# Patient Record
Sex: Female | Born: 2000 | Race: White | Hispanic: No | Marital: Single | Smoking: Never smoker
Health system: Southern US, Community
[De-identification: ages and names within clinical notes are randomized; demographics above are authoritative.]

## PROBLEM LIST (undated history)

## (undated) DIAGNOSIS — J45909 Unspecified asthma, uncomplicated: Secondary | ICD-10-CM

## (undated) HISTORY — PX: OTHER SURGICAL HISTORY: SHX169

---
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
## 2022-03-12 ENCOUNTER — Other Ambulatory Visit: Payer: Self-pay

## 2022-03-12 ENCOUNTER — Emergency Department
Admission: EM | Admit: 2022-03-12 | Discharge: 2022-03-12 | Disposition: A | Discharge status: Home / Self Care | Payer: BC Managed Care – PPO | Attending: Emergency Medicine | Admitting: Emergency Medicine

## 2022-03-12 ENCOUNTER — Emergency Department: Payer: BC Managed Care – PPO

## 2022-03-12 DIAGNOSIS — S6991XA Unspecified injury of right wrist, hand and finger(s), initial encounter: Secondary | ICD-10-CM | POA: Diagnosis present

## 2022-03-12 DIAGNOSIS — Y9364 Activity, baseball: Secondary | ICD-10-CM | POA: Insufficient documentation

## 2022-03-12 DIAGNOSIS — S60221A Contusion of right hand, initial encounter: Secondary | ICD-10-CM | POA: Insufficient documentation

## 2022-03-12 DIAGNOSIS — W2107XA Struck by softball, initial encounter: Secondary | ICD-10-CM | POA: Insufficient documentation

## 2022-03-12 NOTE — Discharge Instructions (Addendum)
Alternate Tylenol and ibuprofen for pain

## 2022-03-12 NOTE — ED Triage Notes (Signed)
Pt presents to ER c/o right hand pain after getting hit by ball around 1945 while playing softball. Pt has some abrasions noted to right hand along with some swelling to inside of right hand.  Pt is able to move all fingers, but states she is having some numbness in her right pinky.  Pt denies any other injuries.  Pt A&O x4 at this time in NAD.

## 2022-03-12 NOTE — ED Provider Notes (Signed)
[facility] Provider Note  Patient Contact: 10:22 PM (approximate)   History   Hand Pain   HPI  Erika Wood is a 21 y.o. female presents to the emergency department with right lateral hand pain after patient caught a softball earlier in the night.  Patient has had some tingling in her right fifth digit since injury occurred.  No similar injuries in the past.  Tetanus status up-to-date.      Physical Exam   Triage Vital Signs: ED Triage Vitals  Enc Vitals Group     BP 03/12/22 2100 119/84     Pulse Rate 03/12/22 2100 95     Resp 03/12/22 2100 17     Temp 03/12/22 2100 98.9 F (37.2 C)     Temp Source 03/12/22 2100 Oral     SpO2 03/12/22 2100 96 %     Weight 03/12/22 2101 130 lb (59 kg)     Height 03/12/22 2101 5\' 7"  (1.702 m)     Head Circumference --      Peak Flow --      Pain Score 03/12/22 2100 7     Pain Loc --      Pain Edu? --      Excl. in GC? --     Most recent vital signs: Vitals:   03/12/22 2100  BP: 119/84  Pulse: 95  Resp: 17  Temp: 98.9 F (37.2 C)  SpO2: 96%     General: Alert and in no acute distress. Eyes:  PERRL. EOMI. Head: No acute traumatic findings ENT:      Ears:       Nose: No congestion/rhinnorhea.      Mouth/Throat: Mucous membranes are moist.  Neck: No stridor. No cervical spine tenderness to palpation. Cardiovascular:  Good peripheral perfusion Respiratory: Normal respiratory effort without tachypnea or retractions. Lungs CTAB. Good air entry to the bases with no decreased or absent breath sounds. Gastrointestinal: Bowel sounds 4 quadrants. Soft and nontender to palpation. No guarding or rigidity. No palpable masses. No distention. No CVA tenderness. Musculoskeletal: Full range of motion to all extremities.  Patient has tenderness to palpation along the ulnar aspect of the right hand.  Palpable radial and ulnar pulses.  Capillary refill less than 2 seconds on the right. Neurologic:  No gross focal  neurologic deficits are appreciated.  Skin:   No rash noted Other:   ED Results / Procedures / Treatments   Labs (all labs ordered are listed, but only abnormal results are displayed) Labs Reviewed - No data to display      RADIOLOGY  I personally viewed and evaluated these images as part of my medical decision making, as well as reviewing the written report by the radiologist.  ED Provider Interpretation: I personally interpreted x-ray of the right hand and there was no acute bony abnormality visualized.   PROCEDURES:  Critical Care performed: No  Procedures   MEDICATIONS ORDERED IN ED: Medications - No data to display   IMPRESSION / MDM / ASSESSMENT AND PLAN / ED COURSE  I reviewed the triage vital signs and the nursing notes.                              Assessment and plan Right hand pain 21 year old female presents to the emergency department with acute right hand pain after catching a softball.  I personally interpreted x-ray of the right hand and there was  no acute bony abnormality visualized.  Recommended Tylenol and ibuprofen alternating for pain and application of ice.  Return precautions were given to return with new or worsening symptoms.     FINAL CLINICAL IMPRESSION(S) / ED DIAGNOSES   Final diagnoses:  Contusion of right hand, initial encounter     Rx / DC Orders   ED Discharge Orders     None        Note:  This document was prepared using Dragon voice recognition software and may include unintentional dictation errors.   Pia Mau Washburn, Cordelia Poche 03/12/22 2224    Chesley Noon, MD 03/17/22 [phone]

## 2024-09-23 NOTE — Progress Notes (Signed)
 Pt did not show for appointment

## 2024-09-25 ENCOUNTER — Ambulatory Visit: Payer: Self-pay | Admitting: Registered Nurse

## 2024-10-31 ENCOUNTER — Encounter: Payer: Self-pay | Admitting: Certified Nurse Midwife

## 2024-10-31 DIAGNOSIS — Z7689 Persons encountering health services in other specified circumstances: Secondary | ICD-10-CM

## 2024-12-12 ENCOUNTER — Encounter: Payer: Self-pay | Admitting: Registered Nurse
